# Patient Record
Sex: Male | Born: 2000 | Race: White | Hispanic: No | Marital: Single | Smoking: Never smoker
Health system: Southern US, Community
[De-identification: ages and names within clinical notes are randomized; demographics above are authoritative.]

---
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
## 2023-05-27 ENCOUNTER — Other Ambulatory Visit: Payer: Self-pay

## 2023-05-27 ENCOUNTER — Emergency Department
Admission: EM | Admit: 2023-05-27 | Discharge: 2023-05-27 | Disposition: A | Discharge status: Home / Self Care | Payer: Self-pay | Attending: Emergency Medicine | Admitting: Emergency Medicine

## 2023-05-27 DIAGNOSIS — S3991XA Unspecified injury of abdomen, initial encounter: Secondary | ICD-10-CM | POA: Diagnosis present

## 2023-05-27 DIAGNOSIS — S39011A Strain of muscle, fascia and tendon of abdomen, initial encounter: Secondary | ICD-10-CM | POA: Insufficient documentation

## 2023-05-27 DIAGNOSIS — Z0279 Encounter for issue of other medical certificate: Secondary | ICD-10-CM | POA: Diagnosis not present

## 2023-05-27 DIAGNOSIS — Y9339 Activity, other involving climbing, rappelling and jumping off: Secondary | ICD-10-CM | POA: Diagnosis not present

## 2023-05-27 DIAGNOSIS — X501XXA Overexertion from prolonged static or awkward postures, initial encounter: Secondary | ICD-10-CM | POA: Diagnosis not present

## 2023-05-27 NOTE — ED Triage Notes (Signed)
Pt reports a few weeks ago he had to help his brother move some deer stands. Pt reports afterwards his stomach started hurting and he thought maybe he pulled a muscle or something. Pt reports it hurt for several days. Pt reports for the last week it has not hurt at all. Pt reports he just needs to be checked out and get a work note to return to work.

## 2023-05-27 NOTE — Discharge Instructions (Signed)
Follow-up with your primary care provider if any continued problems or concerns.  You may take Tylenol or ibuprofen as needed.  Ice or heat to your muscles as needed for discomfort.

## 2023-05-27 NOTE — ED Provider Notes (Signed)
[facility] Provider Note    Event Date/Time   First MD Initiated Contact with Patient 05/27/23 1125     (approximate)   History   Abdominal Pain   HPI  Devin Chavez is a 22 y.o. male   presents to the ED with complaint of abdominal pain after moving some deer stands for his brother.  Patient states he also climbed in several trees.  He reports that he took some over-the-counter medication and for the past week has not had any pain.  He reports that he went deer hunting this morning which required him to climb into a tree and he had no difficulty.  He states that he does need a note to return to work.  Denies any nausea, vomiting, diarrhea, urinary symptoms or previous urolithiasis.      Physical Exam   Triage Vital Signs: ED Triage Vitals  Encounter Vitals Group     BP 05/27/23 1037 (!) 127/58     Systolic BP Percentile --      Diastolic BP Percentile --      Pulse Rate 05/27/23 1037 99     Resp 05/27/23 1037 18     Temp 05/27/23 1037 98.3 F (36.8 C)     Temp Source 05/27/23 1037 Oral     SpO2 05/27/23 1037 99 %     Weight 05/27/23 1031 160 lb (72.6 kg)     Height 05/27/23 1031 5\' 10"  (1.778 m)     Head Circumference --      Peak Flow --      Pain Score 05/27/23 1031 0     Pain Loc --      Pain Education --      Exclude from Growth Chart --     Most recent vital signs: Vitals:   05/27/23 1037  BP: (!) 127/58  Pulse: 99  Resp: 18  Temp: 98.3 F (36.8 C)  SpO2: 99%     General: Awake, no distress.  CV:  Good peripheral perfusion.  Resp:  Normal effort.  Abd:  No distention.  Soft, nontender, bowel sounds normoactive x 4 quadrants.  Patient is able to stand and ambulate without any assistance or difficulties. Other:     ED Results / Procedures / Treatments   Labs (all labs ordered are listed, but only abnormal results are displayed) Labs Reviewed - No data to display    PROCEDURES:  Critical Care performed:    Procedures   MEDICATIONS ORDERED IN ED: Medications - No data to display   IMPRESSION / MDM / ASSESSMENT AND PLAN / ED COURSE  I reviewed the triage vital signs and the nursing notes.   Differential diagnosis includes, but is not limited to, musculoskeletal strain.  21 year old male presents to the ED with complaint of abdominal pain that which is now resolved.  Patient reports that he was doing a lot of heavy lifting helping his brother move deer stands and believes that he pulled some abdominal muscles.  He has not had any continued problems in the last 24 hours and actually went deer hunting this morning climbing a tree without any difficulty.  Patient reports that he just needs a note for work.      Patient's presentation is most consistent with acute, uncomplicated illness.  FINAL CLINICAL IMPRESSION(S) / ED DIAGNOSES   Final diagnoses:  Strain of abdominal muscle, initial encounter     Rx / DC Orders   ED Discharge Orders  None        Note:  This document was prepared using Dragon voice recognition software and may include unintentional dictation errors.   Tommi Rumps, PA-C 05/27/23 1317    Jene Every, MD 05/27/23 1540
